# Patient Record
Sex: Male | Born: 1985 | Hispanic: No | Marital: Married | Smoking: Current every day smoker
Health system: Southern US, Community
[De-identification: ages and names within clinical notes are randomized; demographics above are authoritative.]

## PROBLEM LIST (undated history)

## (undated) DIAGNOSIS — R42 Dizziness and giddiness: Secondary | ICD-10-CM

## (undated) DIAGNOSIS — G40909 Epilepsy, unspecified, not intractable, without status epilepticus: Secondary | ICD-10-CM

## (undated) DIAGNOSIS — G43909 Migraine, unspecified, not intractable, without status migrainosus: Secondary | ICD-10-CM

## (undated) DIAGNOSIS — M24419 Recurrent dislocation, unspecified shoulder: Secondary | ICD-10-CM

---

## 2012-11-10 HISTORY — PX: FOREIGN BODY REMOVAL: SHX962

## 2022-04-12 ENCOUNTER — Other Ambulatory Visit: Payer: Self-pay | Admitting: Student

## 2022-04-12 DIAGNOSIS — S43004A Unspecified dislocation of right shoulder joint, initial encounter: Secondary | ICD-10-CM

## 2022-04-12 DIAGNOSIS — S43491A Other sprain of right shoulder joint, initial encounter: Secondary | ICD-10-CM

## 2022-04-12 DIAGNOSIS — M21829 Other specified acquired deformities of unspecified upper arm: Secondary | ICD-10-CM

## 2022-04-14 ENCOUNTER — Other Ambulatory Visit: Payer: Self-pay | Admitting: Student

## 2022-04-14 ENCOUNTER — Other Ambulatory Visit: Payer: Self-pay | Admitting: Physician Assistant

## 2022-04-14 DIAGNOSIS — H539 Unspecified visual disturbance: Secondary | ICD-10-CM

## 2022-04-14 DIAGNOSIS — R519 Headache, unspecified: Secondary | ICD-10-CM

## 2022-04-14 DIAGNOSIS — R569 Unspecified convulsions: Secondary | ICD-10-CM

## 2022-04-14 DIAGNOSIS — S43004A Unspecified dislocation of right shoulder joint, initial encounter: Secondary | ICD-10-CM

## 2022-04-14 DIAGNOSIS — M21829 Other specified acquired deformities of unspecified upper arm: Secondary | ICD-10-CM

## 2022-04-22 ENCOUNTER — Other Ambulatory Visit: Payer: Self-pay

## 2022-04-23 ENCOUNTER — Other Ambulatory Visit: Payer: Self-pay

## 2022-04-23 ENCOUNTER — Ambulatory Visit: Admission: RE | Admit: 2022-04-23 | Payer: Medicare Other | Source: Ambulatory Visit

## 2022-04-24 ENCOUNTER — Ambulatory Visit
Admission: RE | Admit: 2022-04-24 | Discharge: 2022-04-24 | Disposition: A | Discharge status: Home / Self Care | Payer: Medicare Other | Source: Ambulatory Visit | Attending: Student | Admitting: Student

## 2022-04-24 ENCOUNTER — Ambulatory Visit
Admission: RE | Admit: 2022-04-24 | Discharge: 2022-04-24 | Disposition: A | Discharge status: Home / Self Care | Payer: Medicare Other | Source: Ambulatory Visit | Attending: Physician Assistant | Admitting: Physician Assistant

## 2022-04-24 DIAGNOSIS — M21821 Other specified acquired deformities of right upper arm: Secondary | ICD-10-CM | POA: Insufficient documentation

## 2022-04-24 DIAGNOSIS — M21829 Other specified acquired deformities of unspecified upper arm: Secondary | ICD-10-CM

## 2022-04-24 DIAGNOSIS — H539 Unspecified visual disturbance: Secondary | ICD-10-CM | POA: Diagnosis not present

## 2022-04-24 DIAGNOSIS — S43004A Unspecified dislocation of right shoulder joint, initial encounter: Secondary | ICD-10-CM

## 2022-04-24 DIAGNOSIS — S43491A Other sprain of right shoulder joint, initial encounter: Secondary | ICD-10-CM

## 2022-04-24 DIAGNOSIS — R519 Headache, unspecified: Secondary | ICD-10-CM

## 2022-04-24 DIAGNOSIS — R569 Unspecified convulsions: Secondary | ICD-10-CM | POA: Diagnosis not present

## 2022-04-24 DIAGNOSIS — Y939 Activity, unspecified: Secondary | ICD-10-CM | POA: Insufficient documentation

## 2022-04-24 IMAGING — MR MR SHOULDER*R* W/CM
6 series · 40 of 40 positions shown · IV contrast (agent unspecified)
Comparison: None Available.

CLINICAL DATA: Right shoulder dislocation. Multiple dislocations
since [WS].

EXAM:
MRI OF THE RIGHT SHOULDER WITH CONTRAST
TECHNIQUE: Multiplanar, multisequence MR imaging of the shoulder was performed
following the administration of intra-articular contrast.
CONTRAST:  See Injection Documentation.

[Series 5: T1 fat-sat · axial · right · 4.0mm · 0.55mm/px · z∈[-45,+66]mm · 6 of 24 slices shown (1 of 3)]
[im 1/24]
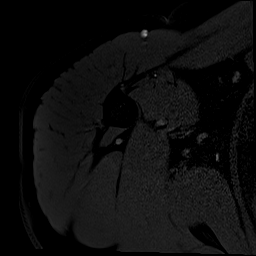
[im 5/24]
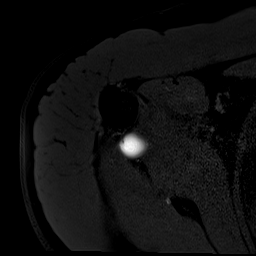
[im 10/24]
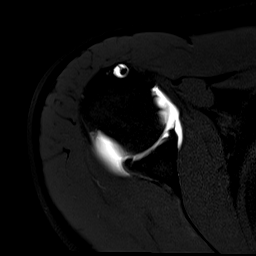
[im 14/24]
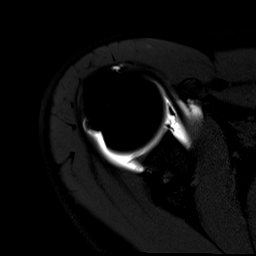
[im 19/24]
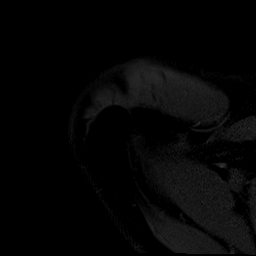
[im 24/24]
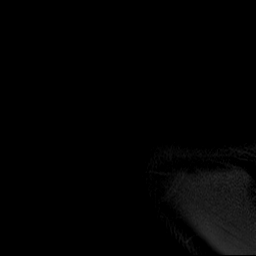

[Series 6: T1 fat-sat · oblique · right · 4.0mm · 0.55mm/px · 6 of 24 slices shown (2 of 3)]
[im 1/24]
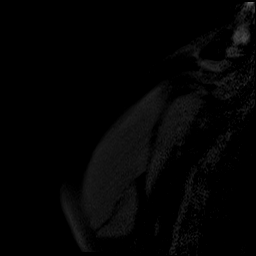
[im 5/24]
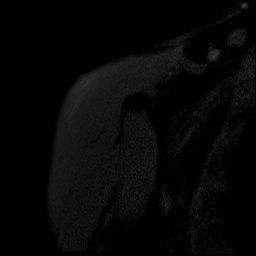
[im 10/24]
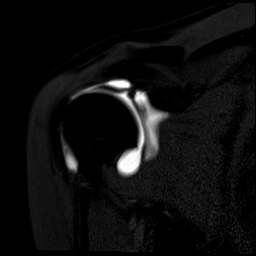
[im 14/24]
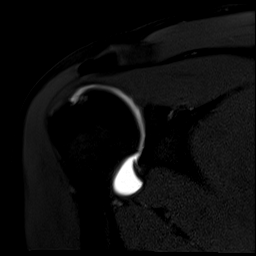
[im 19/24]
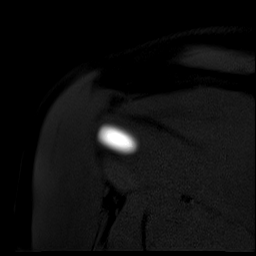
[im 24/24]
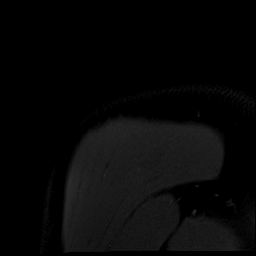

[Series 8: T1 · oblique · right · 4.0mm · 0.51mm/px · 7 of 24 slices shown]
[im 1/24]
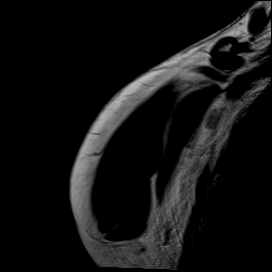
[im 4/24]
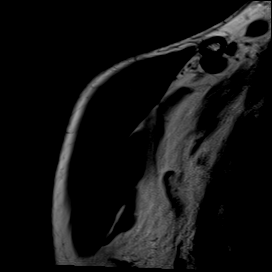
[im 8/24]
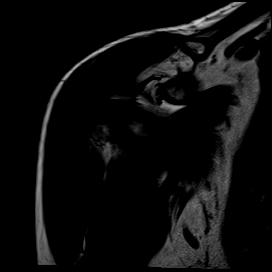
[im 12/24]
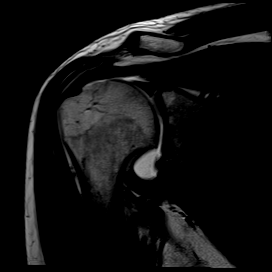
[im 16/24]
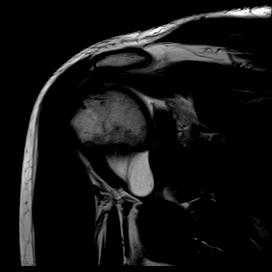
[im 20/24]
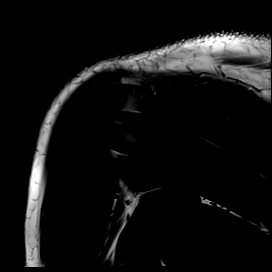
[im 24/24]
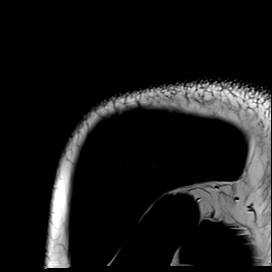

[Series 9: T2 fat-sat · oblique · right · 4.0mm · 0.55mm/px · 7 of 24 slices shown (1 of 2)]
[im 1/24]
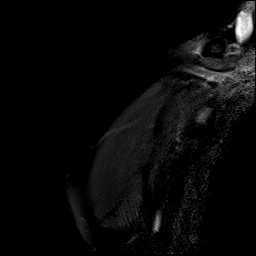
[im 4/24]
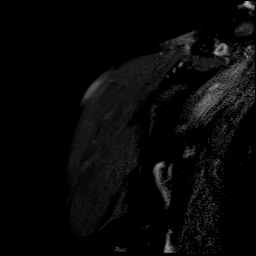
[im 8/24]
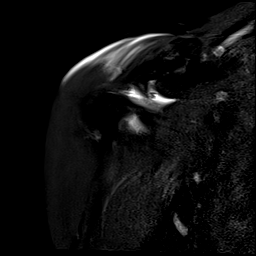
[im 12/24]
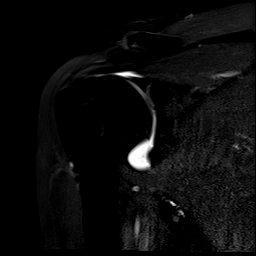
[im 16/24]
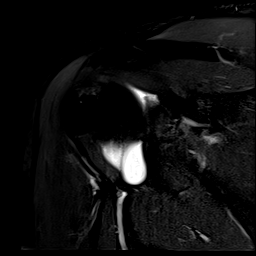
[im 20/24]
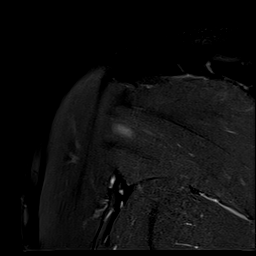
[im 24/24]
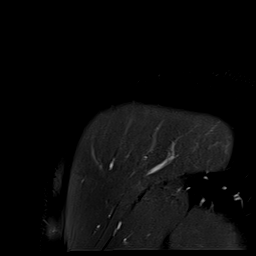

[Series 10: T2 fat-sat · oblique · right · 4.0mm · 0.55mm/px · 7 of 24 slices shown (2 of 2)]
[im 1/24]
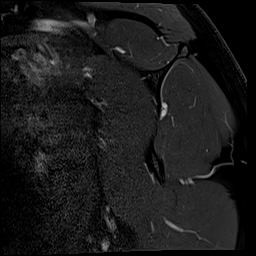
[im 4/24]
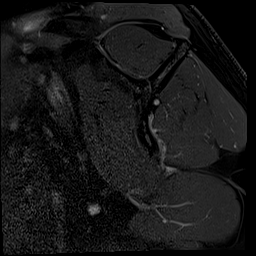
[im 8/24]
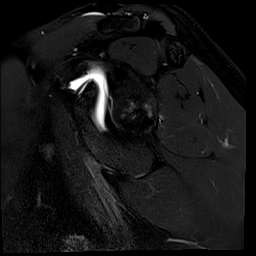
[im 12/24]
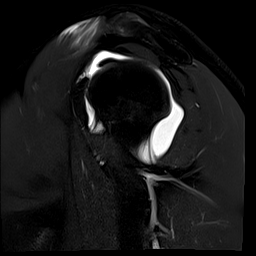
[im 16/24]
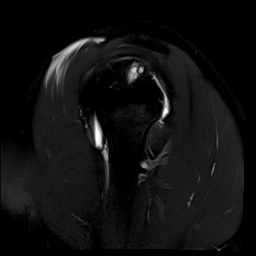
[im 20/24]
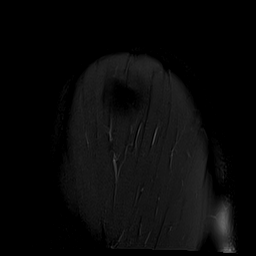
[im 24/24]
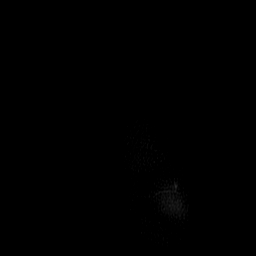

[Series 13: T1 fat-sat · sagittal · right · 4.0mm · 0.62mm/px · 7 of 24 slices shown (3 of 3)]
[im 1/24]
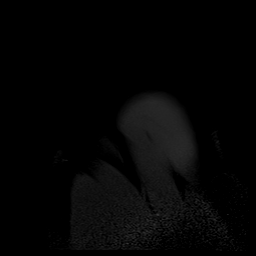
[im 4/24]
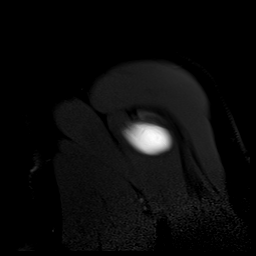
[im 8/24]
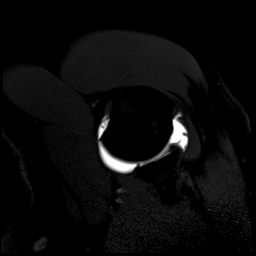
[im 12/24]
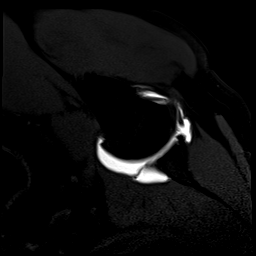
[im 16/24]
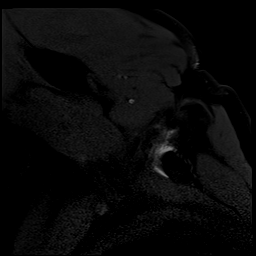
[im 20/24]
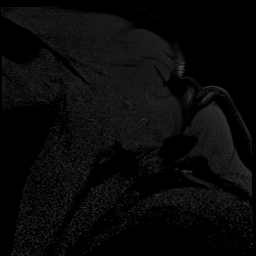
[im 24/24]
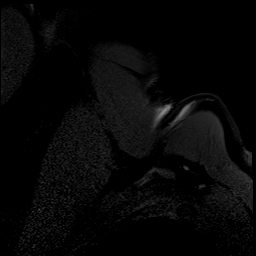

[40 of 40 positions shown; findings below may reference images not displayed]

FINDINGS: Rotator cuff: Supraspinatus tendon is intact. Mild tendinosis of the
infraspinatus tendon with mild subcortical reactive marrow changes.
Teres minor tendon is intact. Subscapularis tendon is intact.

Muscles: No muscle atrophy or edema. No intramuscular fluid
collection or hematoma.

Biceps Long Head: Intraarticular and extraarticular portions of the
biceps tendon are intact.

Acromioclavicular Joint: Mild arthropathy of the acromioclavicular
joint. No subacromial/subdeltoid bursal fluid.

Glenohumeral Joint: Intraarticular contrast distending the joint
capsule. Normal glenohumeral ligaments. High-grade partial-thickness
cartilage loss of the glenohumeral joint with mild subchondral
reactive marrow changes in the glenoid. Patulous joint capsule.

Labrum: Anterior inferior labral tear.

Bones: No fracture or dislocation. No aggressive osseous lesion. Old
Hill-Sachs lesion of the superior posterolateral humeral head.

Other: No fluid collection or hematoma.
IMPRESSION: 1. Anterior inferior labral tear. Old Hill-Sachs lesion of the
superior posterolateral humeral head. Findings consistent with
history of anterior shoulder dislocation.
2. High-grade partial-thickness cartilage loss of the glenohumeral
joint with mild subchondral reactive marrow changes in the glenoid.
3. Mild tendinosis of the infraspinatus tendon with mild subcortical
reactive marrow changes.

## 2022-04-24 IMAGING — RF DG FLUORO GUIDE NDL PLC/BX
1 series · 1 of 1 positions shown · non-contrast
Comparison: none

CLINICAL DATA: Patient with history of dislocation of right
shoulder and Hill-Sachs deformity and Bankart lesion of right
shoulder. Request for right shoulder arthrogram for MRI imaging

[Series 1: cp_standard · 0.18mm/px · 1 of 1 slices shown]
[im 1/1]
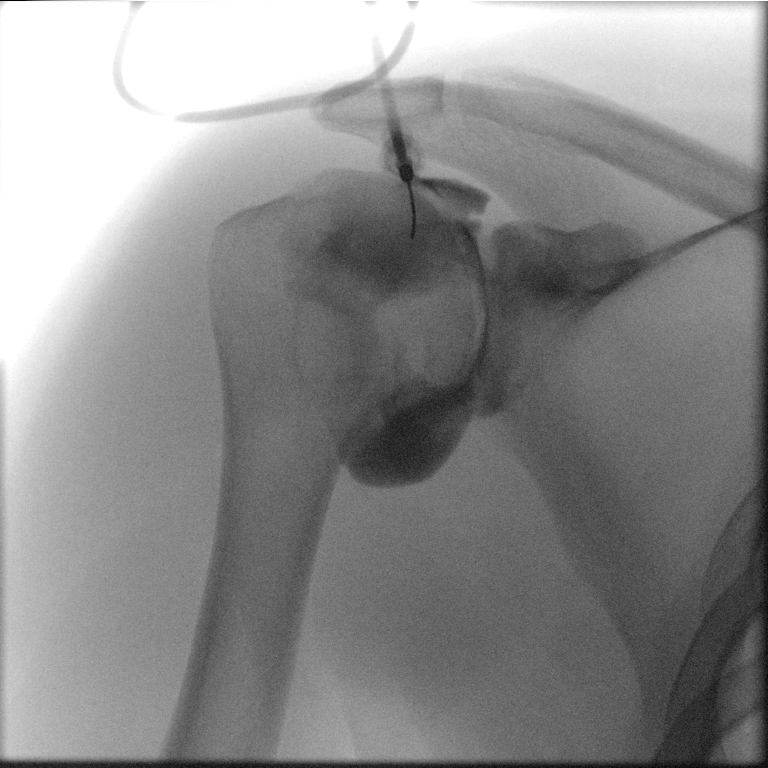

[1 of 1 positions shown; findings below may reference images not displayed]

EXAM:
SHOULDER INJECTION FOR MRI

PROCEDURE:
After a thorough discussion of risks and benefits of the procedure,
written and oral informed consent was obtained. The consent
discussion included the risk of bleeding, infection and injury to
nerves and adjacent blood vessels. Extra-articular injection was
also a possible risk discussed.

Preliminary localization was performed over the right shoulder. The
area was marked over superior medial anterior humeral head.

After prep and drape in the usual sterile fashion, the skin and
deeper subcutaneous tissues were anesthetized with 1% Lidocaine
without Epinephrine. Under fluoroscopic guidance, a 22 gauge
inch spinal needle was advanced into the joint over the superior
medial anterior humeral head using an anterior approach.
Intra-articular injection of Lidocaine was performed which flowed
freely and subsequently the joint was distended with 10 ml of a
dilution of Gadavist contrast. The MR arthrogram solution was as
follows: 15 ml of omnipaque 180 contrast agent, 0.05 mL Gadavist and
5 mL saline. An end point was felt as well as the patient
experiencing pressure and the injection was discontinued, the needle
removed, and a sterile dressing applied. The patient was taken to
MRI for subsequent imaging.

The patient tolerated the procedure well and there were no
complications.

FLUOROSCOPY:
Radiation Exposure Index (as provided by the fluoroscopic device):
3.20 mGy Kerma
IMPRESSION: Successful right shoulder fluoroscopic guided injection.

This exam was performed by KERBELA, NP and was supervised and
interpreted by KERBELA.

## 2022-04-24 IMAGING — MR MR HEAD WO/W CM
15 of 16 series · 43 of 48 positions shown · IV contrast (gadavist)
Comparison: No pertinent prior exams available for comparison.

CLINICAL DATA: Provided history: Seizure-like activity. Headache
disorder. Visual disturbance. Additional history provided by
scanning technologist: Patient reports a history of seizures since
[REDACTED].

EXAM:
MRI HEAD WITHOUT AND WITH CONTRAST
TECHNIQUE: Multiplanar, multiecho pulse sequences of the brain and surrounding
structures were obtained without and with intravenous contrast.
CONTRAST:  7mL GADAVIST GADOBUTROL 1 MMOL/ML IV SOLN

[Series 5: T1 · sagittal · 5.0mm · 0.62mm/px · 2 of 25 slices shown (1 of 2)]
[im 1/25]
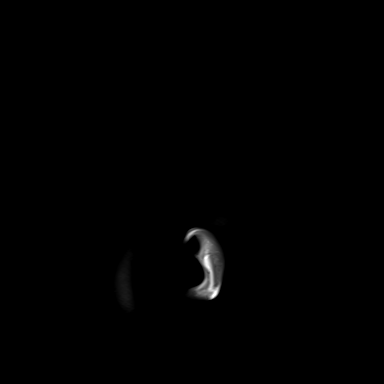
[im 25/25]
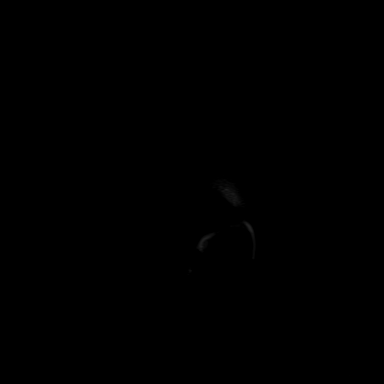

[Series 6: ax dwi_tracew · axial · 3.0mm · 0.71mm/px · z∈[-96,+69]mm · 2 of 56 slices shown]
[im 1/56]
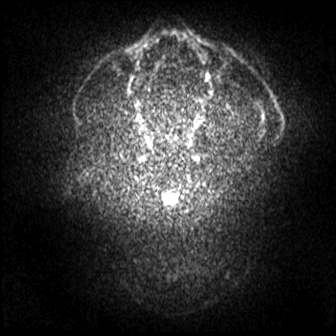
[im 56/56]
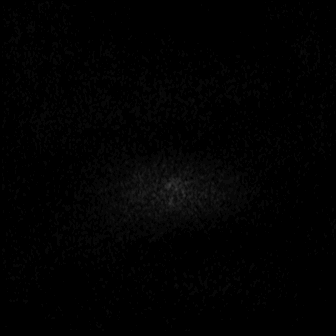

[Series 7: ax dwi_adc · axial · 3.0mm · 0.71mm/px · z∈[-96,+66]mm · 2 of 55 slices shown]
[im 1/55]
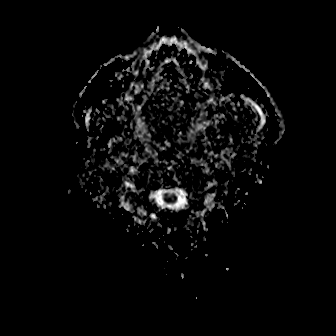
[im 55/55]
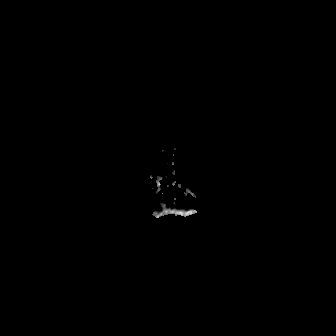

[Series 8: cor dwi_tracew · coronal · 5.0mm · 0.68mm/px · 2 of 40 slices shown]
[im 1/40]
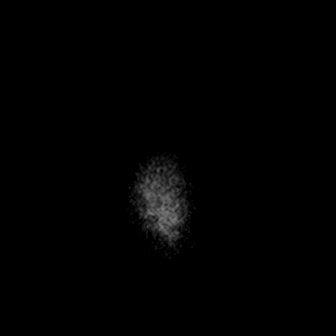
[im 40/40]
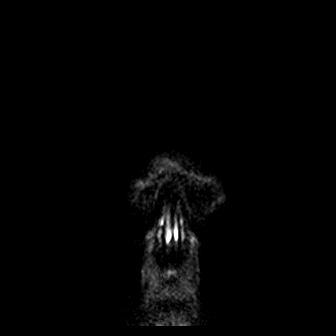

[Series 9: cor dwi_adc · coronal · 5.0mm · 0.68mm/px · 2 of 40 slices shown]
[im 1/40]
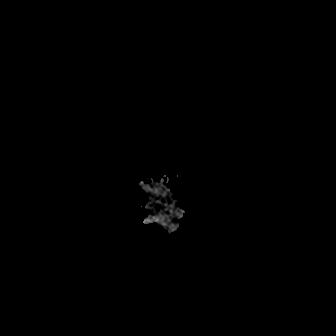
[im 40/40]
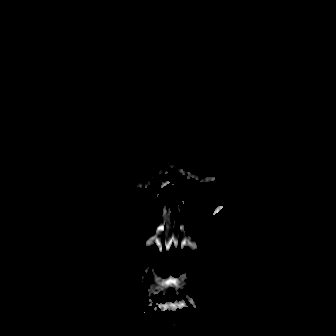

[Series 10: T2 · axial · 5.0mm · 0.53mm/px · 1 of 26 slices shown (1 of 2)]
[im 1/26]
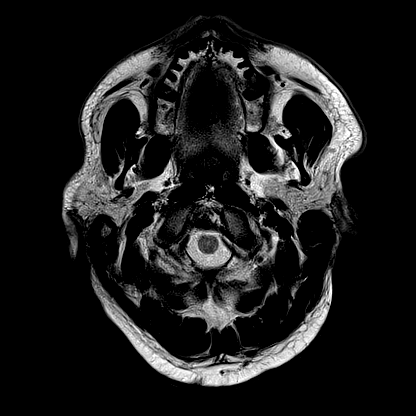

[Series 11: ax swi_mag · axial · 2.0mm · 0.90mm/px · z∈[-93,+65]mm · 3 of 80 slices shown]
[im 1/80]
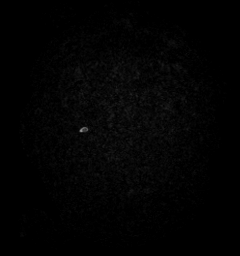
[im 40/80]
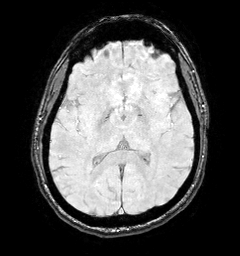
[im 80/80]
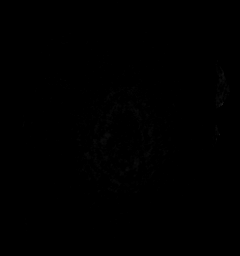

[Series 12: ax swi_pha · axial · 2.0mm · 0.90mm/px · z∈[-93,+65]mm · 3 of 80 slices shown]
[im 1/80]
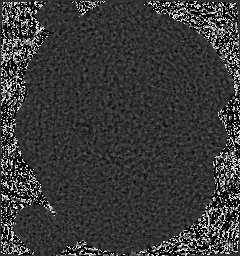
[im 40/80]
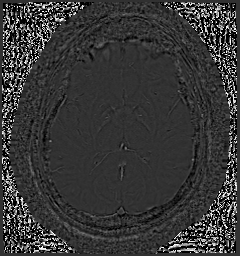
[im 80/80]
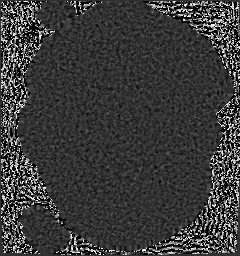

[Series 13: ax swi_swi · axial · 2.0mm · 0.90mm/px · z∈[-93,+65]mm · 3 of 80 slices shown]
[im 1/80]
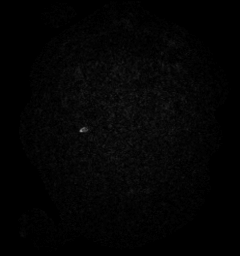
[im 40/80]
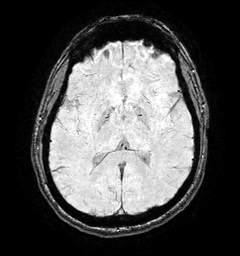
[im 80/80]
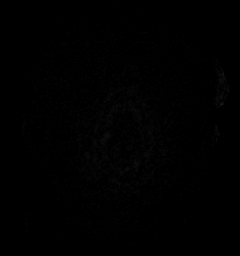

[Series 15: FLAIR · axial · 3.0mm · 0.53mm/px · z∈[-95,+66]mm · 2 of 55 slices shown (1 of 2)]
[im 1/55]
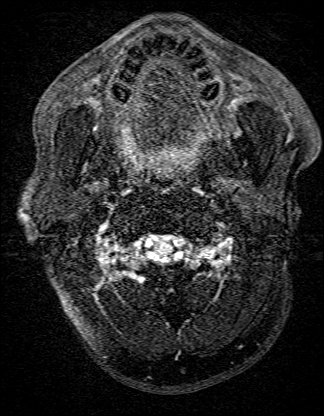
[im 55/55]
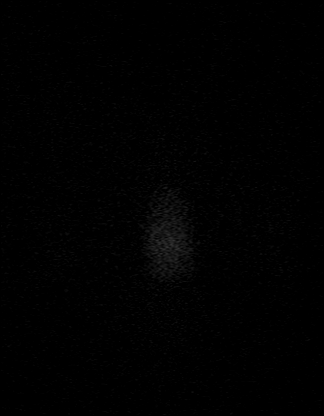

[Series 16: T1 · axial · 1.0mm · 0.98mm/px · z∈[-91,+83]mm · 8 of 176 slices shown (2 of 2)]
[im 1/176]
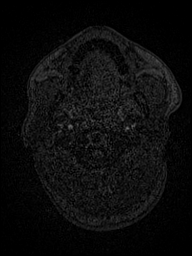
[im 26/176]
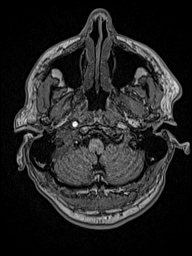
[im 51/176]
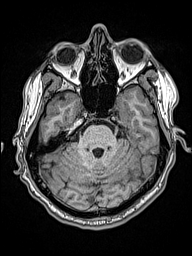
[im 76/176]
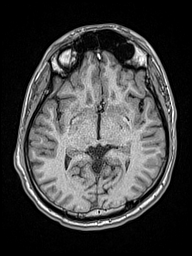
[im 101/176]
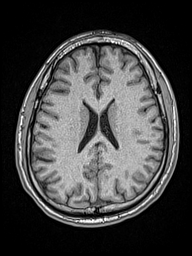
[im 126/176]
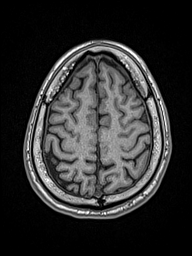
[im 151/176]
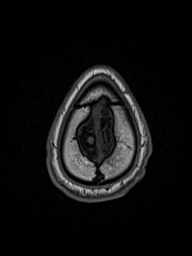
[im 176/176]
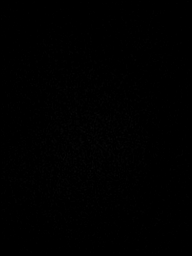

[Series 17: T2 · coronal · 3.0mm · 0.23mm/px · 2 of 35 slices shown (2 of 2)]
[im 1/35]
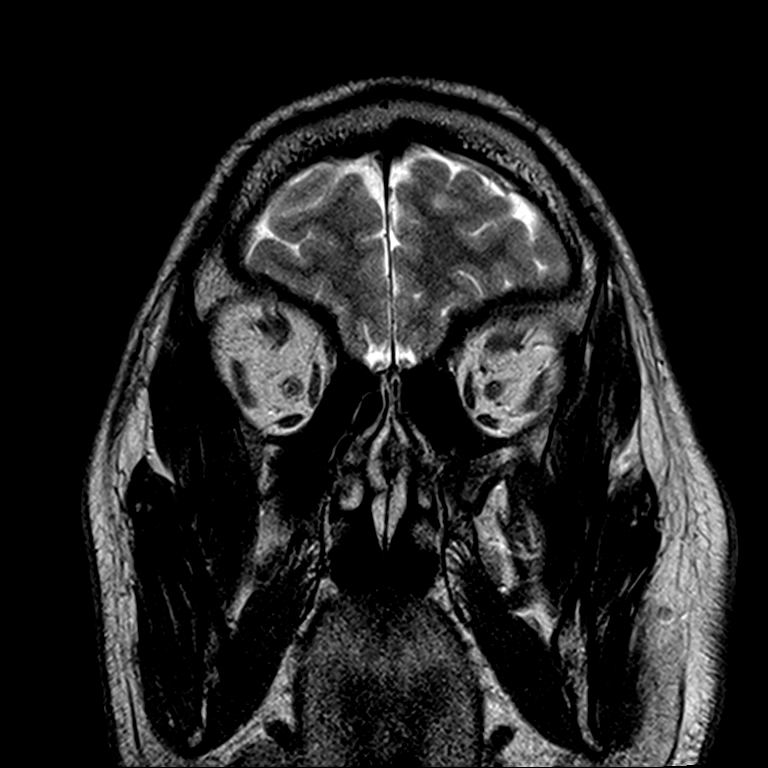
[im 35/35]
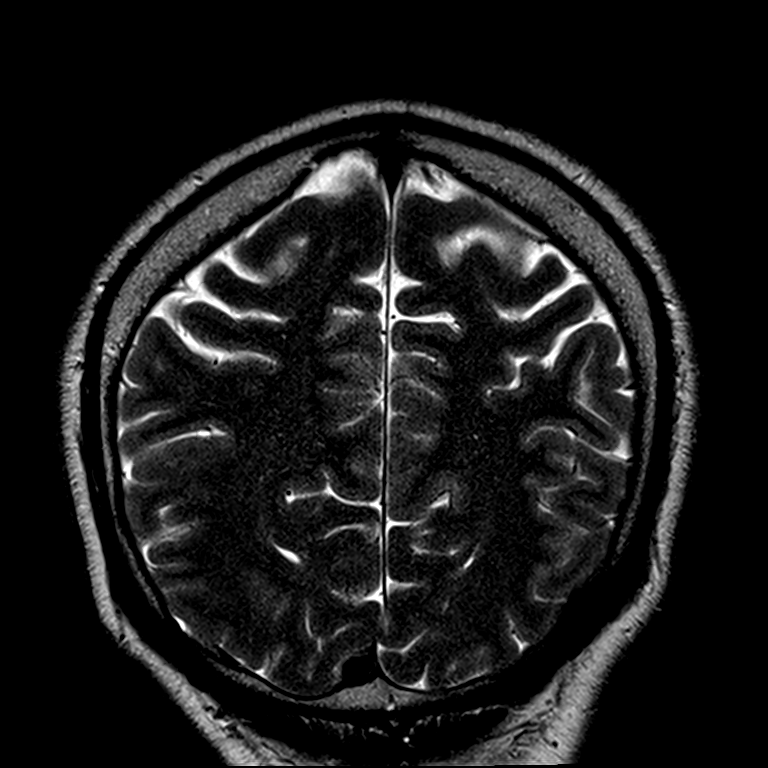

[Series 18: FLAIR · coronal · 3.0mm · 0.35mm/px · 2 of 35 slices shown (2 of 2)]
[im 1/35]
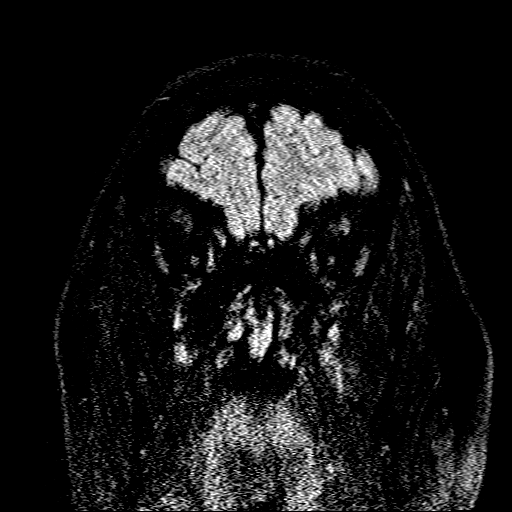
[im 35/35]
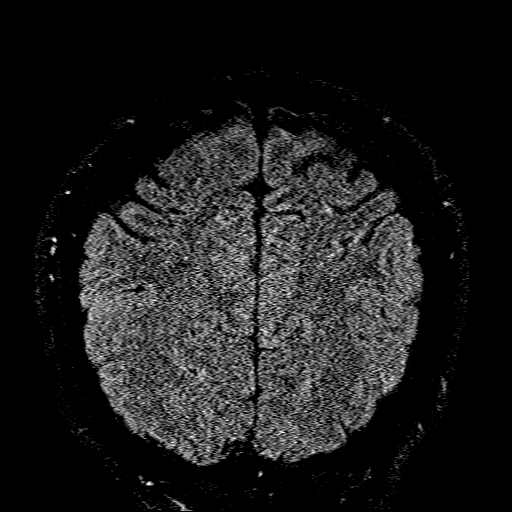

[Series 19: T1 post-contrast · axial · 1.0mm · 0.98mm/px · z∈[-91,+83]mm · 8 of 174 slices shown (1 of 2)]
[im 1/174]
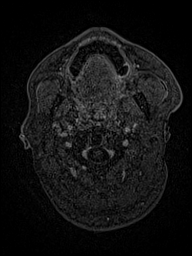
[im 25/174]
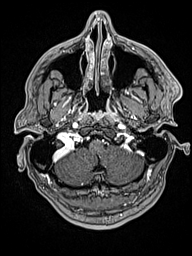
[im 50/174]
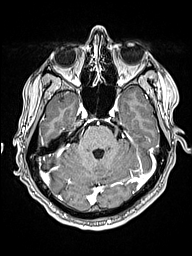
[im 75/174]
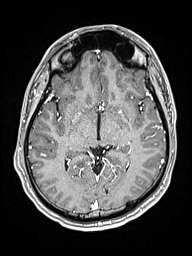
[im 99/174]
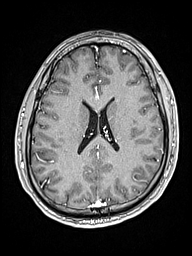
[im 124/174]
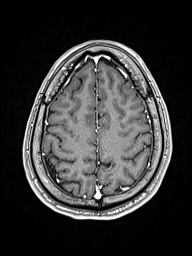
[im 149/174]
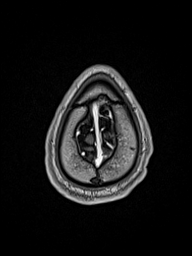
[im 174/174]
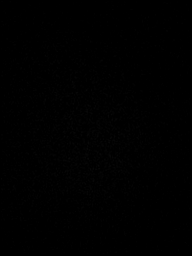

[Series 20: T1 post-contrast · coronal · 5.0mm · 0.57mm/px · 1 of 29 slices shown (2 of 2)]
[im 1/29]
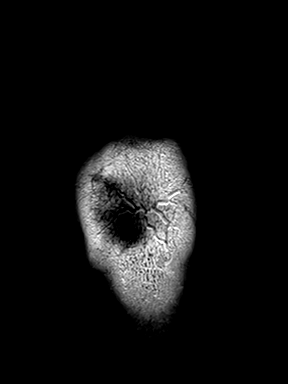

[43 of 48 positions shown; findings below may reference images not displayed]

FINDINGS: Brain:

Cerebral volume is normal.

There are a few small scattered foci of T2 FLAIR hyperintense signal
abnormality within the cerebral white matter, nonspecific.

No cortical encephalomalacia is identified.

No appreciable hippocampal size or signal asymmetry.

There is no acute infarct.

No evidence of an intracranial mass.

No chronic intracranial blood products.

No extra-axial fluid collection.

No midline shift.

No pathologic intracranial enhancement identified.

Vascular: Maintained flow voids within the proximal large arterial
vessels.

Skull and upper cervical spine: No focal suspicious marrow lesion.

Sinuses/Orbits: No mass or acute finding within the imaged orbits.
Mild mucosal thickening within the bilateral ethmoid sinuses.
IMPRESSION: 1. No evidence of acute intracranial abnormality.
2. No specific seizure focus is identified.
3. There are a few small nonspecific T2 FLAIR hyperintense remote
insults within the cerebral white matter.
4. Mild bilateral ethmoid sinus mucosal thickening.

## 2022-04-24 MED ORDER — SODIUM CHLORIDE (PF) 0.9 % IJ SOLN
20.0000 mL | Freq: Once | INTRAMUSCULAR | Status: AC
  Administered 2022-04-24: 5 mL via INTRAVENOUS

## 2022-04-24 MED ORDER — GADOBUTROL 1 MMOL/ML IV SOLN
1.0000 mL | Freq: Once | INTRAVENOUS | Status: AC | PRN
  Administered 2022-04-24: 0.05 mL

## 2022-04-24 MED ORDER — IOHEXOL 180 MG/ML  SOLN
20.0000 mL | Freq: Once | INTRAMUSCULAR | Status: AC | PRN
  Administered 2022-04-24: 15 mL

## 2022-04-24 MED ORDER — LIDOCAINE HCL (PF) 1 % IJ SOLN
10.0000 mL | Freq: Once | INTRAMUSCULAR | Status: AC
  Administered 2022-04-24: 3 mL

## 2022-04-24 MED ORDER — GADOBUTROL 1 MMOL/ML IV SOLN
7.0000 mL | Freq: Once | INTRAVENOUS | Status: AC | PRN
  Administered 2022-04-24: 7 mL via INTRAVENOUS

## 2022-04-24 NOTE — Procedures (Signed)
PROCEDURE SUMMARY:  Successful fluoroscopic guided right shoulder arthrogram.  No immediate complications.  Pt tolerated well.   EBL = < 1 ml  Please see full dictation in imaging section of Epic for procedure details.   Alex Gardener, AGNP-BC 04/24/2022, 3:25 PM

## 2023-03-25 ENCOUNTER — Other Ambulatory Visit: Payer: Self-pay | Admitting: Orthopedic Surgery

## 2023-03-25 ENCOUNTER — Ambulatory Visit
Admission: RE | Admit: 2023-03-25 | Discharge: 2023-03-25 | Disposition: A | Discharge status: Home / Self Care | Payer: Medicare Other | Source: Ambulatory Visit | Attending: Orthopedic Surgery | Admitting: Orthopedic Surgery

## 2023-03-25 DIAGNOSIS — S43004D Unspecified dislocation of right shoulder joint, subsequent encounter: Secondary | ICD-10-CM

## 2023-03-26 ENCOUNTER — Other Ambulatory Visit: Payer: Self-pay | Admitting: Orthopedic Surgery

## 2023-03-27 ENCOUNTER — Other Ambulatory Visit: Payer: Self-pay | Admitting: Orthopedic Surgery

## 2023-03-30 ENCOUNTER — Encounter: Payer: Self-pay | Admitting: Orthopedic Surgery

## 2023-04-03 ENCOUNTER — Encounter
Admission: RE | Disposition: A | Discharge status: Home / Self Care | Payer: Self-pay | Source: Home / Self Care | Attending: Orthopedic Surgery

## 2023-04-03 ENCOUNTER — Encounter: Payer: Self-pay | Admitting: Orthopedic Surgery

## 2023-04-03 ENCOUNTER — Other Ambulatory Visit: Payer: Self-pay

## 2023-04-03 ENCOUNTER — Ambulatory Visit: Payer: Medicare Other | Admitting: Anesthesiology

## 2023-04-03 ENCOUNTER — Ambulatory Visit
Admission: RE | Admit: 2023-04-03 | Discharge: 2023-04-03 | Disposition: A | Discharge status: Home / Self Care | Payer: Medicare Other | Attending: Orthopedic Surgery | Admitting: Orthopedic Surgery

## 2023-04-03 DIAGNOSIS — G40909 Epilepsy, unspecified, not intractable, without status epilepticus: Secondary | ICD-10-CM | POA: Diagnosis not present

## 2023-04-03 DIAGNOSIS — Z79899 Other long term (current) drug therapy: Secondary | ICD-10-CM | POA: Diagnosis not present

## 2023-04-03 DIAGNOSIS — S42291A Other displaced fracture of upper end of right humerus, initial encounter for closed fracture: Secondary | ICD-10-CM | POA: Diagnosis not present

## 2023-04-03 DIAGNOSIS — M24411 Recurrent dislocation, right shoulder: Secondary | ICD-10-CM | POA: Diagnosis present

## 2023-04-03 DIAGNOSIS — X58XXXA Exposure to other specified factors, initial encounter: Secondary | ICD-10-CM | POA: Diagnosis not present

## 2023-04-03 DIAGNOSIS — F17210 Nicotine dependence, cigarettes, uncomplicated: Secondary | ICD-10-CM | POA: Diagnosis not present

## 2023-04-03 DIAGNOSIS — M19011 Primary osteoarthritis, right shoulder: Secondary | ICD-10-CM | POA: Insufficient documentation

## 2023-04-03 HISTORY — PX: SHOULDER ARTHROSCOPY WITH LABRAL REPAIR: SHX5691

## 2023-04-03 HISTORY — DX: Migraine, unspecified, not intractable, without status migrainosus: G43.909

## 2023-04-03 HISTORY — DX: Epilepsy, unspecified, not intractable, without status epilepticus: G40.909

## 2023-04-03 HISTORY — DX: Dizziness and giddiness: R42

## 2023-04-03 HISTORY — DX: Recurrent dislocation, unspecified shoulder: M24.419

## 2023-04-03 SURGERY — ARTHROSCOPY, SHOULDER, WITH GLENOID LABRUM REPAIR
Anesthesia: General | Site: Shoulder | Laterality: Right

## 2023-04-03 MED ORDER — FENTANYL CITRATE PF 50 MCG/ML IJ SOSY
25.0000 ug | PREFILLED_SYRINGE | Freq: Once | INTRAMUSCULAR | Status: AC
  Administered 2023-04-03: 25 ug via INTRAVENOUS

## 2023-04-03 MED ORDER — ONDANSETRON HCL 4 MG/2ML IJ SOLN
INTRAMUSCULAR | Status: DC | PRN
  Administered 2023-04-03: 4 mg via INTRAVENOUS

## 2023-04-03 MED ORDER — IBUPROFEN 800 MG PO TABS: 800.0000 mg | ORAL_TABLET | Freq: Three times a day (TID) | ORAL | 0 refills | Status: AC

## 2023-04-03 MED ORDER — ASPIRIN 325 MG PO TBEC: 325.0000 mg | DELAYED_RELEASE_TABLET | Freq: Every day | ORAL | 0 refills | Status: AC

## 2023-04-03 MED ORDER — ACETAMINOPHEN 500 MG PO TABS: 1000.0000 mg | ORAL_TABLET | Freq: Three times a day (TID) | ORAL | 2 refills | Status: AC

## 2023-04-03 MED ORDER — LIDOCAINE HCL (CARDIAC) PF 100 MG/5ML IV SOSY
PREFILLED_SYRINGE | INTRAVENOUS | Status: DC | PRN
  Administered 2023-04-03: 80 mg via INTRATRACHEAL

## 2023-04-03 MED ORDER — OXYCODONE HCL 5 MG PO TABS: 5.0000 mg | ORAL_TABLET | ORAL | 0 refills | Status: AC | PRN

## 2023-04-03 MED ORDER — DEXMEDETOMIDINE HCL IN NACL 200 MCG/50ML IV SOLN
INTRAVENOUS | Status: DC | PRN
  Administered 2023-04-03 (×5): 4 ug via INTRAVENOUS

## 2023-04-03 MED ORDER — OXYCODONE HCL 5 MG PO TABS
10.0000 mg | ORAL_TABLET | Freq: Once | ORAL | Status: AC
  Administered 2023-04-03: 10 mg via ORAL

## 2023-04-03 MED ORDER — CEFAZOLIN SODIUM-DEXTROSE 2-4 GM/100ML-% IV SOLN
2.0000 g | INTRAVENOUS | Status: AC
  Administered 2023-04-03: 2 g via INTRAVENOUS

## 2023-04-03 MED ORDER — PROPOFOL 10 MG/ML IV BOLUS
INTRAVENOUS | Status: DC | PRN
  Administered 2023-04-03: 150 mg via INTRAVENOUS

## 2023-04-03 MED ORDER — LACTATED RINGERS IV SOLN: INTRAVENOUS | Status: DC

## 2023-04-03 MED ORDER — ONDANSETRON 4 MG PO TBDP: 4.0000 mg | ORAL_TABLET | Freq: Three times a day (TID) | ORAL | 0 refills | Status: AC | PRN

## 2023-04-03 MED ORDER — DEXAMETHASONE SODIUM PHOSPHATE 4 MG/ML IJ SOLN
INTRAMUSCULAR | Status: DC | PRN
  Administered 2023-04-03: 4 mg via INTRAVENOUS

## 2023-04-03 MED ORDER — FENTANYL CITRATE (PF) 100 MCG/2ML IJ SOLN
100.0000 ug | INTRAMUSCULAR | Status: AC | PRN
  Administered 2023-04-03: 50 ug via INTRAVENOUS
  Administered 2023-04-03 (×4): 25 ug via INTRAVENOUS

## 2023-04-03 MED ORDER — MIDAZOLAM HCL 2 MG/2ML IJ SOLN
2.0000 mg | Freq: Once | INTRAMUSCULAR | Status: AC
  Administered 2023-04-03: 2 mg via INTRAVENOUS

## 2023-04-03 MED ORDER — BUPIVACAINE LIPOSOME 1.3 % IJ SUSP
INTRAMUSCULAR | Status: DC | PRN
  Administered 2023-04-03: 10 mg via PERINEURAL

## 2023-04-03 MED ORDER — LACTATED RINGERS IV SOLN
INTRAVENOUS | Status: DC | PRN
  Administered 2023-04-03: 4 mL

## 2023-04-03 MED ORDER — BUPIVACAINE-EPINEPHRINE (PF) 0.5% -1:200000 IJ SOLN
INTRAMUSCULAR | Status: DC | PRN
  Administered 2023-04-03: 20 mL via PERINEURAL

## 2023-04-03 MED ORDER — LACTATED RINGERS IR SOLN
Status: DC | PRN
  Administered 2023-04-03: 6000 mL
  Administered 2023-04-03: 3000 mL
  Administered 2023-04-03: 24000 mL

## 2023-04-03 SURGICAL SUPPLY — 48 items
ADPR IRR PORT MULTIBAG TUBE (MISCELLANEOUS) ×2
ANCH SUT 2 FBRTK KNTLS 1.8 (Anchor) ×9 IMPLANT
ANCH SUT 5 3.9 CRKSW KNTLS (Anchor) ×2 IMPLANT
ANCH SUT SHRT 12.5 CANN EYLT (Anchor) ×1 IMPLANT
ANCHOR 3.9 PEEK CORKSCREW 5MTS (Anchor) IMPLANT
ANCHOR SUT 1.8 FIBERTAK SB KL (Anchor) IMPLANT
ANCHOR SUT BIOCOMP LK 2.9X12.5 (Anchor) IMPLANT
APL PRP STRL LF DISP 70% ISPRP (MISCELLANEOUS) ×1
BLADE SHAVER 4.5X7 STR FR (MISCELLANEOUS) ×1 IMPLANT
CANNULA PART THRD DISP 5.75X7 (CANNULA) IMPLANT
CANNULA TWIST IN 8.25X7CM (CANNULA) IMPLANT
CHLORAPREP W/TINT 26 (MISCELLANEOUS) ×1 IMPLANT
COOLER POLAR GLACIER W/PUMP (MISCELLANEOUS) ×1 IMPLANT
DRAPE IMP U-DRAPE 54X76 (DRAPES) ×1 IMPLANT
DRAPE U-SHAPE 48X52 POLY STRL (PACKS) ×2 IMPLANT
ELECT REM PT RETURN 9FT ADLT (ELECTROSURGICAL) ×1
ELECTRODE REM PT RTRN 9FT ADLT (ELECTROSURGICAL) IMPLANT
GAUZE SPONGE 4X4 12PLY STRL (GAUZE/BANDAGES/DRESSINGS) ×1 IMPLANT
GAUZE XEROFORM 1X8 LF (GAUZE/BANDAGES/DRESSINGS) ×1 IMPLANT
GLOVE SRG 8 PF TXTR STRL LF DI (GLOVE) ×1 IMPLANT
GLOVE SURG ENC TEXT LTX SZ7.5 (GLOVE) ×1 IMPLANT
GLOVE SURG ORTHO LTX SZ8 (GLOVE) ×1 IMPLANT
GLOVE SURG UNDER POLY LF SZ8 (GLOVE) ×3
GOWN STRL REUS W/ TWL LRG LVL3 (GOWN DISPOSABLE) ×1 IMPLANT
GOWN STRL REUS W/TWL LRG LVL3 (GOWN DISPOSABLE) ×2
IV LACTATED RINGER IRRG 3000ML (IV SOLUTION) ×10
IV LR IRRIG 3000ML ARTHROMATIC (IV SOLUTION) ×6 IMPLANT
KIT CORKSCREW KNTLS 3.9 S/T/P (INSTRUMENTS) IMPLANT
KIT CVD SPEAR FBRTK 1.8 DRILL (KITS) IMPLANT
KIT DISPOSABLE PUSHLOCK 2.9MM (KITS) IMPLANT
KIT PERC INSERT 3.0 KNTLS (KITS) IMPLANT
KIT STABILIZATION SHOULDER (MISCELLANEOUS) ×1 IMPLANT
KIT STR SPEAR 1.8 FBRTK DISP (KITS) IMPLANT
KIT TURNOVER KIT A (KITS) ×1 IMPLANT
LASSO 25 DEG RIGHT QUICKPASS (SUTURE) IMPLANT
MANIFOLD NEPTUNE II (INSTRUMENTS) ×1 IMPLANT
MASK FACE SPIDER DISP (MASK) ×1 IMPLANT
MAT ABSORB  FLUID 56X50 GRAY (MISCELLANEOUS) ×2
MAT ABSORB FLUID 56X50 GRAY (MISCELLANEOUS) ×2 IMPLANT
PACK ARTHROSCOPY SHOULDER (MISCELLANEOUS) ×1 IMPLANT
PAD WRAPON POLAR SHDR XLG (MISCELLANEOUS) ×1 IMPLANT
SET Y ADAPTER MULIT-BAG IRRIG (MISCELLANEOUS) ×2 IMPLANT
SUT ETHILON 3-0 (SUTURE) IMPLANT
TAPE MICROFOAM 4IN (TAPE) ×1 IMPLANT
TUBING INFLOW SET DBFLO PUMP (TUBING) ×1 IMPLANT
TUBING OUTFLOW SET DBLFO PUMP (TUBING) ×1 IMPLANT
WAND WEREWOLF FLOW 90D (MISCELLANEOUS) ×1 IMPLANT
WRAPON POLAR PAD SHDR XLG (MISCELLANEOUS) ×1

## 2023-04-03 NOTE — Progress Notes (Signed)
Assisted [place] ANMD with right, interscalene , ultrasound guided block. Side rails up, monitors on throughout procedure. See vital signs in flow sheet. Tolerated Procedure well. 

## 2023-04-03 NOTE — Discharge Instructions (Signed)
Post-Op Instructions  1. Bracing: You will wear a shoulder immobilizer or sling for 4 weeks.  2. Driving: No driving for 4 weeks post-op.   3. Activity: No active lifting for 2 months. Wrist, hand, and elbow motion only. Avoid lifting the upper arm away from the body except for hygiene. You are permitted to bend and straighten the elbow actively. You may use your hand and wrist for typing, writing, and managing utensils (cutting food). Do not lift more than a coffee cup for 8 weeks.  When sleeping or resting, inclined positions (recliner chair or wedge pillow) and a pillow under the forearm for support may provide better comfort for up to 4 weeks.  Avoid long distance travel for 4 weeks.  Return to normal activities after labral repair normally takes 6 months on average. If rehab goes very well, may be able to do most activities at 4 months, except overhead or contact sports.  4. Physical Therapy: Begins 3-4 days after surgery, and proceeds 2 times per week for the first 4 weeks, then 1-2 times per week from weeks 4-8 post-op.  5. Medications:  - You will be provided a prescription for narcotic pain medicine. After surgery, take 1-2 narcotic tablets every 4 hours if needed for severe pain.  - A prescription for anti-nausea medication will be provided in case the narcotic medicine causes nausea - take 1 tablet every 6 hours only if nauseated.   - Take tylenol 1000 mg every 8 hours for pain.  May stop tylenol 3 days after surgery if you are having minimal pain.  If you are taking prescription medication for anxiety, depression, insomnia, muscle spasm, chronic pain, or for attention deficit disorder, you are advised that you are at a higher risk of adverse effects with use of narcotics post-op, including narcotic addiction/dependence, depressed breathing, death. If you use non-prescribed substances: alcohol, marijuana, cocaine, heroin, methamphetamines, etc., you are at a higher risk of adverse  effects with use of narcotics post-op, including narcotic addiction/dependence, depressed breathing, death. You are advised that taking > 50 morphine milligram equivalents (MME) of narcotic pain medication per day results in twice the risk of overdose or death. For your prescription provided: oxycodone 5 mg - taking more than 6 tablets per day would result in > 50 morphine milligram equivalents (MME) of narcotic pain medication. Be advised that we will prescribe narcotics short-term, for acute post-operative pain only - 3 weeks for major operations such as shoulder repair/reconstruction surgeries.   6. Post-Op Appointment:  Your first post-op appointment will be ~2 weeks post-op.  7. Work or School: For most, but not all procedures, we advise staying out of work or school for at least 1 to 2 weeks in order to recover from the stress of surgery and to allow time for healing.   If you need a work or school note this can be provided.   Post-operative Brace: Apply and remove the brace you received as you were instructed to at the time of fitting and as described in detail as the brace's instructions for use indicate.  Wear the brace for the period of time prescribed by your physician.  The brace can be cleaned with soap and water and allowed to air dry only.  Should the brace result in increased pain, decreased feeling (numbness/tingling), increased swelling or an overall worsening of your medical condition, please contact your doctor immediately.  If an emergency situation occurs as a result of wearing the brace after normal business hours,  please dial 911 and seek immediate medical attention.  Let your doctor know if you have any further questions about the brace issued to you. Refer to the shoulder sling instructions for use if you have any questions regarding the correct fit of your shoulder sling.  [facility] Customer Care for Troubleshooting: [phone]  Video that illustrates how to properly use a  shoulder sling: "Instructions for Proper Use of an Orthopaedic Sling" [email or web address].com/

## 2023-04-03 NOTE — Anesthesia Procedure Notes (Signed)
Anesthesia Regional Block: Interscalene brachial plexus block   Pre-Anesthetic Checklist: , timeout performed,  Correct Patient, Correct Site, Correct Laterality,  Correct Procedure, Correct Position, site marked,  Risks and benefits discussed,  Surgical consent,  Pre-op evaluation,  At surgeon's request and post-op pain management  Laterality: Right  Prep: chloraprep       Needles:  Injection technique: Single-shot  Needle Type: Stimiplex     Needle Length: 10cm  Needle Gauge: 21     Additional Needles:   Procedures:,,,, ultrasound used (permanent image in chart),,    Narrative:  Start time: 04/03/2023 6:59 AM End time: 04/03/2023 7:11 AM Injection made incrementally with aspirations every 5 mL.  Performed by: Personally   Additional Notes: Functioning IV was confirmed and monitors applied. Ultrasound guidance: relevant anatomy identified, needle position confirmed, local anesthetic spread visualized around nerve(s)., vascular puncture avoided.  Image printed for medical record.  Negative aspiration and no paresthesias; incremental administration of local anesthetic. The patient tolerated the procedure well. Vitals signes recorded in RN notes. Also superficial cervical plexus block and intercostobrachial

## 2023-04-03 NOTE — H&P (Signed)
Paper H&P to be scanned into permanent record. H&P reviewed. No significant changes noted.  

## 2023-04-03 NOTE — Op Note (Signed)
Operative Note    SURGERY DATE: 04/03/2023   PRE-OP DIAGNOSIS:  1. Right shoulder multiple anterior dislocations  2. Right shoulder large Hill-Sachs lesion 3. Right glenohumeral degenerative changes   POST-OP DIAGNOSIS:  1. Right shoulder multiple anterior dislocations  2. Right shoulder large Hill-Sachs lesion 3. Right glenohumeral degenerative changes and loose bodies   PROCEDURES:  1.  Right shoulder arthroscopic anterior labral repair and capsulorraphy 2.  Right shoulder Remplissage procedure 3.  Right shoulder extensive glenohumeral and subacromial debridement 4.  Right shoulder arthroscopic loose body removal   SURGEON: Rosealee Albee, MD   ASSISTANT(S):  Sonny Dandy, PA   ANESTHESIA: Regional + Gen   TOTAL IV FLUIDS: see anesthesia record   ESTIMATED BLOOD LOSS: minimal    DRAINS: None    SPECIMENS: None.    IMPLANTS:  - Arthrex 1.38mm knotless FiberTak suture anchors x 5 - Arthrex 2.63mm PushLock anchor x 1 - Arthrex 3.5mm knotless Corkscrew x 2    COMPLICATIONS: None    OPERATIVE FINDINGS:  Examination under anesthesia: A careful examination under anesthesia was performed.  Passive range of motion was: FF: 160; ER at side: 50; ER in abduction: 95; IR in abduction: 45.  Anterior load shift: 2+.  Posterior load shift: 1+.  Sulcus in neutral: 1+.  Sulcus in ER: 1+     Intra-operative findings: A thorough arthroscopic examination of the shoulder was performed.  The findings are: 1. Biceps tendon: normal 2. Superior labrum: mild degeneration, no SLAP tear 3. Posterior labrum and capsule: Mild synovitis about posterior capsule.  Tear of the inferior aspect of the posterior labrum around the 7 o'clock position extending anteriorly 4. Inferior capsule and inferior recess: patulous capsule with torn labrum 5. Glenoid cartilage surface: Significant grade 4 degenerative changes of the anterior/inferior quadrant.  Grade 1 degenerative changes otherwise scattered  diffusely.  Glenoid bone with ~5% loss at 4:30 position.  6. Supraspinatus attachment:  normal 7. Posterior rotator cuff attachment: normal, large Hill-Sachs lesion present 8. Humeral head articular cartilage: Scattered grade 1-2 degenerative changes 9. Rotator interval: significant synovitis 10: Subscapularis tendon: attachment intact 11. Anterior labrum: Diminutive labrum with tear from 2:30 - 5:00 position with tear extending to 7 o'clock position; patulous anterior capsule 12. IGHL: stretched.  No HAGL lesion.   OPERATIVE REPORT:    Indications for procedure: Lee Coffey is a 37 y.o. 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 The patient has had at least 5 prior dislocation episodes all due to prior seizures.  He has not had any dislocations over the last 2 years as he has been on appropriate medications to help control his seizures.  He does have continued pain and apprehension sensations. MRI and CT scan showed detached anteroinferior labral tear with a patulous capsule as well as a very large Hill-Sachs lesion.  There were also early degenerative changes, likely due to multiple prior dislocations, present on preoperative imaging.  The patient had minimal glenoid bone loss on advanced imaging. After discussion of risks, benefits, and alternatives to surgery, the patient elected to proceed with anterior labral repair and capsulorraphy with remplissage to reduce the risk of recurrent dislocation.    Procedure in detail:   I identified Lee Coffey in the pre-operative holding area. The risks, benefits, complications, treatment options, and expected outcomes were discussed with the patient. The risks and potential complications include, but are not limited to failure to fully relieve pain, infection, neurovascular compromise, complications from anesthesia, stiff shoulder, recurrent dislocation, and failure of  surgery with continued pain. The patient concurred with the proposed plan, giving informed  consent. I marked the operative shoulder with my initials.  Anesthesia was then performed with an interscalene block with Exparel.  The patient was transferred to the operative suite and placed in the beach chair position.     Appropriate IV antibiotics were administered prior to incision. The operative upper extremity was then prepped and draped in standard fashion. A time out was performed confirming the correct extremity, correct patient, and correct procedure.    I then created a standard posterior portal with an 11 blade. The glenohumeral joint was easily entered with a blunt trochar and the arthroscope introduced. A high anterior and lateral portal was made. The findings of diagnostic arthroscopy are described above. Next, an anterior inferior portal was made just lateral to the coracoid entering just above the subscapularis.  A 7mm cannula was placed. I first debrided and then coagulated the inflamed synovium about the rotator interval to obtain hemostasis and reduce the risk of post-operative swelling using an Arthrocare radiofrequency device.  The superior labrum was also debrided.  Camera was then transitioned to the anterolateral portal.  There was significant synovitis about the posterior capsule as well.  This was debrided and coagulated.  Degenerative areas of the posterior labrum were also debrided with an oscillating shaver.  Furthermore, the unstable cartilaginous edges of the glenoid and humeral head were also debrided with an oscillating shaver.  There were multiple loose cartilaginous pieces present within the joint as well.  The largest piece measured approximately 6 mm.  These were removed with an arthroscopic grasper, and small pieces were removed with an oscillating shaver.  An ArthroCare wand was used to remove any soft tissue from the Hill-Sachs defect.  The defect was then prepared with a curette.  A separate far lateral posterior portal was made under needle localization.  A 7 mm  cannula was placed deep to the deltoid but superficial to the infraspinatus and posterior capsule.  Using the guide to pierce the infraspinatus and posterior capsule, a Knotless Corkscrew anchor was passed at the inferior portion of the Hill-Sachs defect.  Next, going through the same cannula, through a more superior portion of the infraspinatus/capsule, a second Knotless Corkscrew anchor was passed at the superior portion of the Hill-Sachs defect.  Both anchors were placed at the medial-most aspect of the defect adjacent to the articular surface.  The knotless mechanism from the inferior anchor was utilized to shuttle the superior repair suture and vice versa.  The sutures were left partially tensioned for full tensioning at the end of the surgery.  An accessory portal of [place] was made next in order to address the posterior labrum.  A cannula was placed.   An elevator was used to elevate the labrum off the glenoid from the 2:30-7:00 position. A rasp was used to roughen the glenoid for improvement of healing. A shaver was also used to debride degenerative labrum and further clean the glenoid face to allow for improved healing. The curved drill guide for the knotless FiberTak was placed at the 7:00. This was drilled and anchor was placed.  The Arthrex SutureLasso SD (25 degree to the left) was then used to pass the nitinol wire loop through the capsule and under the labrum. This was passed in an inferior to superior fashion at the level of the anchor.  Repair suture was shuttled through the nitinol wire and then through the anchor using the passing suture.  However, this did not get appropriately shuttled through the anchor.  Therefore this repair Fibertak anchor was placed at the 6 o'clock position and shuttled through the labrum and then the anchor in a similar fashion.  This was then tensioned, but not completely.  Arthroscope was switched to the posterior portal.  Further anchors were placed at the 5:30,  4:30, 3:30, and 2:30 positions.  After all anchors were placed, they were sequentially cinched down and cut, nicely tightening the capsule and labrum onto the glenoid while allowing for an inferior to superior capsular shift. This created an excellent labral bumper, capsulorrhaphy, and labral repair.  The humeral head was also noted to be more centered.   The arthroscope was placed back into the anterolateral portal. Lastly, the remplissage sutures were tightened sequentially while viewing the Hill-Sachs defect.  With final tightening, the infraspinatus tendon filled the entirety of the Hill-Sachs defect.  Suture tails were cut and tendon was firmly down to bone on probing.   Fluid was evacuated from the shoulder, and the portals were closed with 3-0 Nylon. Xeroform was applied to the portals. A sterile dressing was applied, followed by a Polar Care sleeve and a SlingShot shoulder immobilizer/sling. The patient awoke from anesthesia without difficulty and was transferred to the PACU in stable condition.    Of note, assistance from a PA was essential to performing the surgery.  PA was present for the entire surgery.  PA assisted with patient positioning, retraction, instrumentation, and wound closure. The surgery would have been more difficult and had longer operative time without PA assistance.       COMPLICATIONS: none   DISPOSITION: plan for discharge home after recovery in PACU   POSTOPERATIVE PLAN: Remain in sling (except hygiene and elbow/wrist/hand RoM exercises as instructed by PT) x 4 weeks and NWB for this time. PT to begin 3-4 days after surgery. Anterior shoulder stabilization/capsulorraphy protocol.

## 2023-04-03 NOTE — Anesthesia Preprocedure Evaluation (Addendum)
Anesthesia Evaluation  Patient identified by MRN, date of birth, ID band Patient awake    Reviewed: Allergy & Precautions, H&P , NPO status , Patient's Chart, lab work & pertinent test results  Airway Mallampati: III  TM Distance: >3 FB Neck ROM: Full    Dental no notable dental hx.    Pulmonary Current Smoker   Pulmonary exam normal breath sounds clear to auscultation       Cardiovascular negative cardio ROS Normal cardiovascular exam Rhythm:Regular Rate:Normal     Neuro/Psych negative neurological ROS  negative psych ROS   GI/Hepatic negative GI ROS, Neg liver ROS,,,  Endo/Other  negative endocrine ROS    Renal/GU negative Renal ROS  negative genitourinary   Musculoskeletal negative musculoskeletal ROS (+)    Abdominal   Peds negative pediatric ROS (+)  Hematology negative hematology ROS (+)   Anesthesia Other Findings Epilepsy (HCC)  Migraine headache Vertigo Recurrent shoulder dislocation    Reproductive/Obstetrics negative OB ROS                             Anesthesia Physical Anesthesia Plan  ASA: 2  Anesthesia Plan: General   Post-op Pain Management: Regional block   Induction: Intravenous  PONV Risk Score and Plan:   Airway Management Planned: LMA  Additional Equipment:   Intra-op Plan:   Post-operative Plan: Extubation in OR  Informed Consent: I have reviewed the patients History and Physical, chart, labs and discussed the procedure including the risks, benefits and alternatives for the proposed anesthesia with the patient or authorized representative who has indicated his/her understanding and acceptance.     Dental Advisory Given  Plan Discussed with: Anesthesiologist, CRNA and Surgeon  Anesthesia Plan Comments: (Patient consented for risks of anesthesia including but not limited to:  - adverse reactions to medications - damage to eyes, teeth, lips or  other oral mucosa - nerve damage due to positioning  - sore throat or hoarseness - Damage to heart, brain, nerves, lungs, other parts of body or loss of life  Patient voiced understanding.)       Anesthesia Quick Evaluation

## 2023-04-03 NOTE — Anesthesia Procedure Notes (Signed)
Procedure Name: LMA Insertion Date/Time: 04/03/2023 7:49 AM  Performed by: Barbette Hair, CRNAPre-anesthesia Checklist: Patient identified, Emergency Drugs available, Suction available, Patient being monitored and Timeout performed Oxygen Delivery Method: Circle system utilized Preoxygenation: Pre-oxygenation with 100% oxygen Induction Type: IV induction LMA: LMA inserted LMA Size: 4.0 Number of attempts: 1 Tube secured with: Tape Dental Injury: Teeth and Oropharynx as per pre-operative assessment

## 2023-04-03 NOTE — Transfer of Care (Signed)
Immediate Anesthesia Transfer of Care Note  Patient: Lee Coffey  Procedure(s) Performed: Right shoulder arthroscopic anterior labral repair and capsulorrhaphy, remplissage (Right: Shoulder)  Patient Location: PACU  Anesthesia Type: General  Level of Consciousness: awake, alert  and patient cooperative  Airway and Oxygen Therapy: Patient Spontanous Breathing and Patient connected to supplemental oxygen  Post-op Assessment: Post-op Vital signs reviewed, Patient's Cardiovascular Status Stable, Respiratory Function Stable, Patent Airway and No signs of Nausea or vomiting  Post-op Vital Signs: Reviewed and stable  Complications: No notable events documented.

## 2023-04-03 NOTE — Anesthesia Postprocedure Evaluation (Signed)
Anesthesia Post Note  Patient: Lee Coffey  Procedure(s) Performed: Right shoulder arthroscopic anterior labral repair and capsulorrhaphy, remplissage (Right: Shoulder)  Patient location during evaluation: PACU Anesthesia Type: General Level of consciousness: awake and alert Pain management: pain level controlled Vital Signs Assessment: post-procedure vital signs reviewed and stable Respiratory status: spontaneous breathing, nonlabored ventilation, respiratory function stable and patient connected to nasal cannula oxygen Cardiovascular status: blood pressure returned to baseline and stable Postop Assessment: no apparent nausea or vomiting Anesthetic complications: no   No notable events documented.   Last Vitals:  Vitals:   04/03/23 1145 04/03/23 1200  BP: (!) 127/99 (!) 135/101  Pulse: 71 81  Resp:    Temp:  36.4 C  SpO2: 98% 91%    Last Pain:  Vitals:   04/03/23 1200  TempSrc:   PainSc: Asleep                 Preslyn Warr C [place] Depaz

## 2023-04-07 ENCOUNTER — Encounter: Payer: Self-pay | Admitting: Orthopedic Surgery

## 2023-07-06 ENCOUNTER — Emergency Department
Admission: EM | Admit: 2023-07-06 | Discharge: 2023-07-06 | Disposition: A | Discharge status: Home / Self Care | Payer: No Typology Code available for payment source | Attending: Emergency Medicine | Admitting: Emergency Medicine

## 2023-07-06 ENCOUNTER — Other Ambulatory Visit: Payer: Self-pay

## 2023-07-06 ENCOUNTER — Emergency Department: Payer: No Typology Code available for payment source

## 2023-07-06 DIAGNOSIS — S199XXA Unspecified injury of neck, initial encounter: Secondary | ICD-10-CM | POA: Diagnosis present

## 2023-07-06 DIAGNOSIS — M791 Myalgia, unspecified site: Secondary | ICD-10-CM | POA: Diagnosis not present

## 2023-07-06 DIAGNOSIS — S161XXA Strain of muscle, fascia and tendon at neck level, initial encounter: Secondary | ICD-10-CM | POA: Diagnosis not present

## 2023-07-06 DIAGNOSIS — M546 Pain in thoracic spine: Secondary | ICD-10-CM | POA: Diagnosis not present

## 2023-07-06 DIAGNOSIS — M7918 Myalgia, other site: Secondary | ICD-10-CM

## 2023-07-06 DIAGNOSIS — Y92410 Unspecified street and highway as the place of occurrence of the external cause: Secondary | ICD-10-CM | POA: Diagnosis not present

## 2023-07-06 DIAGNOSIS — M25511 Pain in right shoulder: Secondary | ICD-10-CM | POA: Insufficient documentation
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
## 2023-10-25 DIAGNOSIS — R569 Unspecified convulsions: Secondary | ICD-10-CM | POA: Diagnosis not present

## 2023-11-05 DIAGNOSIS — Z03818 Encounter for observation for suspected exposure to other biological agents ruled out: Secondary | ICD-10-CM | POA: Diagnosis not present

## 2023-11-05 DIAGNOSIS — R0602 Shortness of breath: Secondary | ICD-10-CM | POA: Diagnosis not present

## 2023-11-05 DIAGNOSIS — R1310 Dysphagia, unspecified: Secondary | ICD-10-CM | POA: Diagnosis not present

## 2023-12-23 DIAGNOSIS — Z13220 Encounter for screening for lipoid disorders: Secondary | ICD-10-CM | POA: Diagnosis not present

## 2023-12-23 DIAGNOSIS — Z13228 Encounter for screening for other metabolic disorders: Secondary | ICD-10-CM | POA: Diagnosis not present

## 2023-12-23 DIAGNOSIS — Z7689 Persons encountering health services in other specified circumstances: Secondary | ICD-10-CM | POA: Diagnosis not present

## 2023-12-23 DIAGNOSIS — Z6829 Body mass index (BMI) 29.0-29.9, adult: Secondary | ICD-10-CM | POA: Diagnosis not present

## 2023-12-23 DIAGNOSIS — N529 Male erectile dysfunction, unspecified: Secondary | ICD-10-CM | POA: Diagnosis not present

## 2023-12-23 DIAGNOSIS — G40909 Epilepsy, unspecified, not intractable, without status epilepticus: Secondary | ICD-10-CM | POA: Diagnosis not present

## 2023-12-23 DIAGNOSIS — S43004D Unspecified dislocation of right shoulder joint, subsequent encounter: Secondary | ICD-10-CM | POA: Diagnosis not present

## 2023-12-29 DIAGNOSIS — R29898 Other symptoms and signs involving the musculoskeletal system: Secondary | ICD-10-CM | POA: Diagnosis not present

## 2023-12-29 DIAGNOSIS — R569 Unspecified convulsions: Secondary | ICD-10-CM | POA: Diagnosis not present

## 2023-12-29 DIAGNOSIS — R519 Headache, unspecified: Secondary | ICD-10-CM | POA: Diagnosis not present

## 2023-12-29 DIAGNOSIS — F39 Unspecified mood [affective] disorder: Secondary | ICD-10-CM | POA: Diagnosis not present

## 2024-01-13 DIAGNOSIS — R569 Unspecified convulsions: Secondary | ICD-10-CM | POA: Diagnosis not present

## 2024-01-26 DIAGNOSIS — Z0000 Encounter for general adult medical examination without abnormal findings: Secondary | ICD-10-CM | POA: Diagnosis not present

## 2024-01-26 DIAGNOSIS — N529 Male erectile dysfunction, unspecified: Secondary | ICD-10-CM | POA: Diagnosis not present

## 2024-01-26 DIAGNOSIS — E7801 Familial hypercholesterolemia: Secondary | ICD-10-CM | POA: Diagnosis not present

## 2024-01-26 DIAGNOSIS — Z6830 Body mass index (BMI) 30.0-30.9, adult: Secondary | ICD-10-CM | POA: Diagnosis not present

## 2024-03-01 DIAGNOSIS — F39 Unspecified mood [affective] disorder: Secondary | ICD-10-CM | POA: Diagnosis not present

## 2024-03-01 DIAGNOSIS — G40309 Generalized idiopathic epilepsy and epileptic syndromes, not intractable, without status epilepticus: Secondary | ICD-10-CM | POA: Diagnosis not present

## 2024-03-01 DIAGNOSIS — R29898 Other symptoms and signs involving the musculoskeletal system: Secondary | ICD-10-CM | POA: Diagnosis not present

## 2024-03-01 DIAGNOSIS — R519 Headache, unspecified: Secondary | ICD-10-CM | POA: Diagnosis not present

## 2024-03-15 DIAGNOSIS — R0609 Other forms of dyspnea: Secondary | ICD-10-CM | POA: Diagnosis not present

## 2024-03-15 DIAGNOSIS — N529 Male erectile dysfunction, unspecified: Secondary | ICD-10-CM | POA: Diagnosis not present

## 2024-03-15 DIAGNOSIS — Z6830 Body mass index (BMI) 30.0-30.9, adult: Secondary | ICD-10-CM | POA: Diagnosis not present

## 2024-03-15 DIAGNOSIS — E7801 Familial hypercholesterolemia: Secondary | ICD-10-CM | POA: Diagnosis not present

## 2024-03-18 DIAGNOSIS — R0609 Other forms of dyspnea: Secondary | ICD-10-CM | POA: Diagnosis not present

## 2024-04-26 DIAGNOSIS — Z79899 Other long term (current) drug therapy: Secondary | ICD-10-CM | POA: Diagnosis not present

## 2024-04-26 DIAGNOSIS — R569 Unspecified convulsions: Secondary | ICD-10-CM | POA: Diagnosis not present

## 2024-04-27 DIAGNOSIS — F39 Unspecified mood [affective] disorder: Secondary | ICD-10-CM | POA: Diagnosis not present

## 2024-04-27 DIAGNOSIS — R519 Headache, unspecified: Secondary | ICD-10-CM | POA: Diagnosis not present

## 2024-04-27 DIAGNOSIS — Z1331 Encounter for screening for depression: Secondary | ICD-10-CM | POA: Diagnosis not present

## 2024-04-27 DIAGNOSIS — G40309 Generalized idiopathic epilepsy and epileptic syndromes, not intractable, without status epilepticus: Secondary | ICD-10-CM | POA: Diagnosis not present

## 2024-06-06 DIAGNOSIS — Z79899 Other long term (current) drug therapy: Secondary | ICD-10-CM | POA: Diagnosis not present

## 2024-06-06 DIAGNOSIS — G40309 Generalized idiopathic epilepsy and epileptic syndromes, not intractable, without status epilepticus: Secondary | ICD-10-CM | POA: Diagnosis not present

## 2024-06-29 DIAGNOSIS — E7801 Familial hypercholesterolemia: Secondary | ICD-10-CM | POA: Diagnosis not present

## 2024-06-29 DIAGNOSIS — N529 Male erectile dysfunction, unspecified: Secondary | ICD-10-CM | POA: Diagnosis not present

## 2024-07-05 DIAGNOSIS — E7801 Familial hypercholesterolemia: Secondary | ICD-10-CM | POA: Diagnosis not present

## 2024-07-05 DIAGNOSIS — E291 Testicular hypofunction: Secondary | ICD-10-CM | POA: Diagnosis not present

## 2024-07-05 DIAGNOSIS — Z6829 Body mass index (BMI) 29.0-29.9, adult: Secondary | ICD-10-CM | POA: Diagnosis not present

## 2024-07-05 DIAGNOSIS — K219 Gastro-esophageal reflux disease without esophagitis: Secondary | ICD-10-CM | POA: Diagnosis not present

## 2024-07-05 DIAGNOSIS — F17200 Nicotine dependence, unspecified, uncomplicated: Secondary | ICD-10-CM | POA: Diagnosis not present

## 2024-07-05 DIAGNOSIS — R519 Headache, unspecified: Secondary | ICD-10-CM | POA: Diagnosis not present

## 2024-07-05 DIAGNOSIS — G40309 Generalized idiopathic epilepsy and epileptic syndromes, not intractable, without status epilepticus: Secondary | ICD-10-CM | POA: Diagnosis not present

## 2024-07-05 DIAGNOSIS — F39 Unspecified mood [affective] disorder: Secondary | ICD-10-CM | POA: Diagnosis not present

## 2024-07-05 DIAGNOSIS — R29898 Other symptoms and signs involving the musculoskeletal system: Secondary | ICD-10-CM | POA: Diagnosis not present

## 2024-08-08 DIAGNOSIS — N529 Male erectile dysfunction, unspecified: Secondary | ICD-10-CM | POA: Diagnosis not present

## 2024-09-09 DIAGNOSIS — E291 Testicular hypofunction: Secondary | ICD-10-CM | POA: Diagnosis not present

## 2024-10-20 DIAGNOSIS — F17210 Nicotine dependence, cigarettes, uncomplicated: Secondary | ICD-10-CM | POA: Diagnosis not present

## 2024-10-20 DIAGNOSIS — R569 Unspecified convulsions: Secondary | ICD-10-CM | POA: Diagnosis not present

## 2024-10-20 DIAGNOSIS — G40409 Other generalized epilepsy and epileptic syndromes, not intractable, without status epilepticus: Secondary | ICD-10-CM | POA: Diagnosis not present

## 2024-10-20 DIAGNOSIS — G40909 Epilepsy, unspecified, not intractable, without status epilepticus: Secondary | ICD-10-CM | POA: Diagnosis not present

## 2024-10-20 DIAGNOSIS — Z79899 Other long term (current) drug therapy: Secondary | ICD-10-CM | POA: Diagnosis not present

## 2024-10-20 DIAGNOSIS — R519 Headache, unspecified: Secondary | ICD-10-CM | POA: Diagnosis not present

## 2024-10-24 DIAGNOSIS — N529 Male erectile dysfunction, unspecified: Secondary | ICD-10-CM | POA: Diagnosis not present

## 2024-10-24 DIAGNOSIS — Z6829 Body mass index (BMI) 29.0-29.9, adult: Secondary | ICD-10-CM | POA: Diagnosis not present

## 2024-10-24 DIAGNOSIS — M79676 Pain in unspecified toe(s): Secondary | ICD-10-CM | POA: Diagnosis not present

## 2024-11-01 DIAGNOSIS — M79676 Pain in unspecified toe(s): Secondary | ICD-10-CM | POA: Diagnosis not present

## 2024-11-01 DIAGNOSIS — N529 Male erectile dysfunction, unspecified: Secondary | ICD-10-CM | POA: Diagnosis not present

## 2024-11-02 DIAGNOSIS — Z6830 Body mass index (BMI) 30.0-30.9, adult: Secondary | ICD-10-CM | POA: Diagnosis not present

## 2024-11-02 DIAGNOSIS — N50819 Testicular pain, unspecified: Secondary | ICD-10-CM | POA: Diagnosis not present

## 2024-11-04 ENCOUNTER — Ambulatory Visit (HOSPITAL_BASED_OUTPATIENT_CLINIC_OR_DEPARTMENT_OTHER)
Admission: RE | Admit: 2024-11-04 | Discharge: 2024-11-04 | Disposition: A | Discharge status: Home / Self Care | Source: Ambulatory Visit | Attending: Physician Assistant | Admitting: Physician Assistant

## 2024-11-04 ENCOUNTER — Other Ambulatory Visit (HOSPITAL_BASED_OUTPATIENT_CLINIC_OR_DEPARTMENT_OTHER): Payer: Self-pay | Admitting: Physician Assistant

## 2024-11-04 DIAGNOSIS — N50811 Right testicular pain: Secondary | ICD-10-CM | POA: Diagnosis not present

## 2024-11-04 DIAGNOSIS — Z79899 Other long term (current) drug therapy: Secondary | ICD-10-CM | POA: Diagnosis not present

## 2024-11-04 DIAGNOSIS — N5089 Other specified disorders of the male genital organs: Secondary | ICD-10-CM

## 2024-11-04 DIAGNOSIS — N50819 Testicular pain, unspecified: Secondary | ICD-10-CM | POA: Diagnosis not present

## 2024-11-04 DIAGNOSIS — R1030 Lower abdominal pain, unspecified: Secondary | ICD-10-CM | POA: Diagnosis not present

## 2024-11-04 DIAGNOSIS — F17210 Nicotine dependence, cigarettes, uncomplicated: Secondary | ICD-10-CM | POA: Diagnosis not present

## 2024-11-04 DIAGNOSIS — N50812 Left testicular pain: Secondary | ICD-10-CM | POA: Diagnosis not present

## 2024-11-05 DIAGNOSIS — N50819 Testicular pain, unspecified: Secondary | ICD-10-CM | POA: Diagnosis not present

## 2024-11-05 NOTE — ED Provider Notes (Addendum)
 EMERGENCY MEDICINE  CHIEF COMPLAINT: Chief Complaint  Patient presents with   Testicle Pain   History of Present Illness Lee Coffey is a 38 year old male who presents with worsening groin pain. He has been experiencing groin pain for the past couple of months, which has progressively worsened. Initially, the pain was rated at a 5 out of 10, but over the past week, it has escalated to an 8, and at times reaches a 10, especially when walking. The pain is described as excruciating and is aggravated by movement, making walking uncomfortable. He describes the sensation as if his 'boxers are riding up,' although they are not, and finds walking like a 'penguin' to be the most comfortable. The pain becomes more manageable when lying down, which is the only position that feels relatively painless. Sitting is uncomfortable, and standing or walking causes the pain to spike. He has not noticed any specific swelling or changes in the size of his testicles, although he mentions a sensation of swelling when standing. No history of kidney stones or penile discharge, and no significant changes in urination. He has experienced loose stools for about a week but denies any vomiting, night sweats, or weight loss. He has not taken any pain medication prior to the visit.  An ultrasound was performed yesterday, ordered by his primary care physician, but the results were not available to him at the time of the visit.  Triage: Presents with sharp bilateral testicle pain the has been intermittent for the past 2 months and has gotten worse in the past week. Had an outpatient ultrasound today at [facility] and has not received the results from his provider yet. Took ibuprofen  and a muscle relaxer that he cannot recall the name of about 3 hours prior to arrival.   Old records reviewed: Testicular ultrasound completed at [facility] yesterday  PAST MEDICAL HISTORY: Past Medical History[1]  PAST SURGICAL HISTORY:  has a past  surgical history that includes Rotator cuff repair (Right, 03/2023).  MEDICATIONS: Prior to Admission medications  Medication Sig Start Date Authorizing Provider  lamoTRIgine  (LAMICTAL ) 100 MG tablet Take 3 tablets (300 mg total) by mouth daily.  [provider]  naproxen (NAPROSYN) 500 MG tablet naproxen 500 mg tablet  PRN 04/10/17 [provider]  tadalafiL (CIALIS) 10 MG tablet Take 1 tablet (10 mg total) by mouth as needed in the morning for erectile dysfunction. 1 tab taken one hour prior to sex. 04/16/21 Geralene Lynwood Rouse, MD  topiramate (TOPAMAX) 50 MG tablet Take 1 tablet (50 mg total) by mouth Two (2) times a day. Take one tab daily for 1 week.  Then take 1 tablet twice daily. Patient not taking: Reported on 10/20/2024 07/17/21 Geralene Lynwood Rouse, MD  traZODone (DESYREL) 100 MG tablet Take 1 tablet (100 mg total) by mouth nightly. 08/14/20 Geralene Lynwood Rouse, MD   ALLERGIES: Allergies[2]  FAMILY HISTORY: Family History[3]  SOCIAL HISTORY: Social History   Tobacco Use   Smoking status: Every Day    Current packs/day: 0.50    Average packs/day: 0.5 packs/day for 24.0 years (12.0 ttl pk-yrs)    Types: Cigarettes    Start date: 2002   Smokeless tobacco: Never  Substance Use Topics   Alcohol use: Yes    Comment: occassionally   REVIEW OF SYSTEMS:  10 point completed, negative aside from positives noted in HPI  PHYSICAL EXAM: Vitals:   11/04/24 2202 11/05/24 0707  BP: 144/96 119/84  Pulse: 89 74  Resp: 18  18  Temp: 36.8 C (98.2 F) 36.3 C (97.4 F)  TempSrc: Temporal Temporal  SpO2: 97% 99%   Constitutional: Alert and oriented.  Eyes: Conjunctivae are normal. ENT      Head: Normocephalic and atraumatic.      Nose: No congestion.      Mouth/Throat: Mucous membranes are moist.      Neck: No stridor. Cardiovascular: Normal rate, regular rhythm. Extremities warm. Respiratory: Normal respiratory effort, breathing comfortably on room  air.  Gastrointestinal: Appears soft, nontender and nondistended.  GU: Normal external male genitalia. No drainage from external meatus. No phimosis.  Foreskin easily retractable.  No testicular tenderness to palpation, no hydrocele. Cremasteric reflexes present bilaterally. No palpable masses. Perineum nontender. No inguinal hernias or deformities. Atraumatic without lesions noted, no erythema or edema.  Musculoskeletal: Nontender and non-edematous with equal range of motion in all extremities. Neurologic: Normal speech and language. No gross focal neurologic deficits are appreciated. Skin: Skin is warm, dry and intact. No rash noted on visible skin. Psychiatric: Mood and affect are congruent.  DIAGNOSTICS:   Imaging: CT Pelvis Wo Contrast    (Results Pending)   Labs: Labs Reviewed - No data to display  Orders Placed This Encounter  Procedures   CT Pelvis Wo Contrast   Ambulatory referral to Urology   Medications  ketorolac (TORADOL) injection 30 mg (30 mg Intramuscular Given 11/05/24 0548)  acetaminophen  (TYLENOL ) tablet 1,000 mg (1,000 mg Oral Given 11/05/24 0548)   ED COURSE AND MEDICAL DECISION MAKING:  Medical Decision Making 38 year old male with PMH of epilepsy presented with several months of progressively worsening groin pain, now severe with ambulation and relieved by lying down.  Vitals within normal limits.  Physical exam was normal.  Recent scrotal ultrasound identified a small right testicular mass, which was not tender and can be followed up outpatient by urology, referral placed.  Emergent repeat ultrasound not indicated at this time given very reassuring gonadal exam.  Differential diagnosis includes, but is not limited to: - Nephrolithiasis (referred pain): Nephrolithiasis was considered as a possible cause of referred groin pain, though absence of urinary symptoms and typical presentation makes it less likely. - Chronic pelvic pain syndrome: Chronic pelvic pain  syndrome was considered as a non-life-threatening etiology for persistent groin pain in the absence of acute findings. - Testicular torsion: Testicular torsion was considered but deemed highly unlikely based on physical exam and ultrasound findings. - Testicular mass (leiomyoma, adenomatoid tumor, sperm cell granuloma): A small right testicular mass was identified on ultrasound, considered benign and unrelated to the current pain.  Progress Notes: ED Course as of 11/05/24 0745  Sat Nov 05, 2024  0545 Jolynn Pack Scrotum US  completed yesterday: IMPRESSION:  1. Right epididymal head hypoechoic vascular mass measuring 5 x 3 x 5 mm. The differential diagnosis includes epididymal leiomyoma, adenomatoid tumor,  sperm cell granuloma and others.  2. No evidence of testicular torsion.    9381 Patient sleeping comfortably  0722 CT results pending, anticipate signout at 730 to oncoming attending  [phone] Preliminary results occurred prior to shift change, CT pelvis without acute findings.  Laboratory tests and radiology reports that were available during my shift were reviewed and considered in the medical decision making. I discussed the medical work-up findings, my rationale behind my medical decision making and my treatment plan with the patient who understands and is comfortable with the plan of care. Reports understanding of diagnosis, discharge instructions, and return precautions specific to the symptoms and diagnosis. Questions  answered. Patient has received maximum benefit of ED visit today and is safe for discharge home.   FINAL IMPRESSION: Final diagnoses:  Pain in both testicles (Primary)   Rx:  New Prescriptions   No medications on file       [facility], Jillian N, MD 11/05/24 9277       [1] Past Medical History: Diagnosis Date   Migraine    Seizures    (CMS-HCC)   [2] No Known Allergies [3] Family History Problem Relation Age of Onset   Tremor Father    Alcohol abuse  Father    Merica, Jillian N, MD 11/05/24 [phone]

## 2024-11-07 DIAGNOSIS — Z6830 Body mass index (BMI) 30.0-30.9, adult: Secondary | ICD-10-CM | POA: Diagnosis not present

## 2024-11-07 DIAGNOSIS — Z113 Encounter for screening for infections with a predominantly sexual mode of transmission: Secondary | ICD-10-CM | POA: Diagnosis not present

## 2024-11-07 DIAGNOSIS — N5089 Other specified disorders of the male genital organs: Secondary | ICD-10-CM | POA: Diagnosis not present

## 2024-11-07 DIAGNOSIS — E291 Testicular hypofunction: Secondary | ICD-10-CM | POA: Diagnosis not present

## 2024-11-28 ENCOUNTER — Encounter: Payer: Self-pay | Admitting: Urology

## 2024-11-28 ENCOUNTER — Ambulatory Visit (INDEPENDENT_AMBULATORY_CARE_PROVIDER_SITE_OTHER): Admitting: Urology

## 2024-11-28 VITALS — BP 135/85 | HR 62 | Ht 67.0 in | Wt 186.0 lb

## 2024-11-28 DIAGNOSIS — N5089 Other specified disorders of the male genital organs: Secondary | ICD-10-CM

## 2024-11-28 DIAGNOSIS — N5082 Scrotal pain: Secondary | ICD-10-CM | POA: Diagnosis not present
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
## 2024-11-28 NOTE — Patient Instructions (Signed)
 Scheduling number: [phone]

## 2024-11-30 ENCOUNTER — Ambulatory Visit
Admission: RE | Admit: 2024-11-30 | Discharge: 2024-11-30 | Disposition: A | Discharge status: Home / Self Care | Source: Ambulatory Visit | Attending: Urology | Admitting: Urology

## 2024-11-30 DIAGNOSIS — N5082 Scrotal pain: Secondary | ICD-10-CM | POA: Insufficient documentation

## 2024-12-04 ENCOUNTER — Ambulatory Visit: Payer: Self-pay | Admitting: Urology

## 2025-01-06 ENCOUNTER — Ambulatory Visit: Admitting: Physician Assistant
